# Patient Record
Sex: Male | Born: 1995 | Race: White | Hispanic: No | Marital: Single | Smoking: Never smoker
Health system: Southern US, Community
[De-identification: ages and names within clinical notes are randomized; demographics above are authoritative.]

## PROBLEM LIST (undated history)

## (undated) DIAGNOSIS — F909 Attention-deficit hyperactivity disorder, unspecified type: Secondary | ICD-10-CM

## (undated) DIAGNOSIS — S4990XA Unspecified injury of shoulder and upper arm, unspecified arm, initial encounter: Secondary | ICD-10-CM

## (undated) HISTORY — PX: TONSILLECTOMY: SUR1361

---
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
## 2014-08-18 ENCOUNTER — Ambulatory Visit (INDEPENDENT_AMBULATORY_CARE_PROVIDER_SITE_OTHER): Payer: BLUE CROSS/BLUE SHIELD | Admitting: Family Medicine

## 2014-08-18 VITALS — BP 108/62 | HR 83 | Temp 97.8°F | Resp 17 | Ht 66.5 in | Wt 178.6 lb

## 2014-08-18 DIAGNOSIS — R059 Cough, unspecified: Secondary | ICD-10-CM

## 2014-08-18 DIAGNOSIS — J069 Acute upper respiratory infection, unspecified: Secondary | ICD-10-CM

## 2014-08-18 DIAGNOSIS — R05 Cough: Secondary | ICD-10-CM

## 2014-08-18 DIAGNOSIS — R42 Dizziness and giddiness: Secondary | ICD-10-CM

## 2014-08-18 DIAGNOSIS — J00 Acute nasopharyngitis [common cold]: Secondary | ICD-10-CM

## 2014-08-18 MED ORDER — BENZONATATE 100 MG PO CAPS: 100.0000 mg | ORAL_CAPSULE | Freq: Three times a day (TID) | ORAL | Status: DC | PRN

## 2014-08-18 MED ORDER — IPRATROPIUM BROMIDE 0.03 % NA SOLN
2.0000 | Freq: Two times a day (BID) | NASAL | Status: DC
Start: 2014-08-18 — End: 2015-06-18

## 2014-08-18 NOTE — Progress Notes (Signed)
Subjective: 19 year old male who is here with history of being ill since Saturday. He does not work on the weekends. He has had some lightheadedness. He has a morning sore throat and mild ear pain. He's had a little cough. He does have some head stuffiness has been sniffling. She blows some stuff out of his nose. He does not smoke. He gets a lot of exercise with his work at [employer]. He lives at home with his parents. No one else at home is been ill. He does not use drugs and is not on any regular medications.  Objective: Normally healthy-appearing young man. His TMs are normal. Eyes PERRLA. EOMs intact. Nose is a little congested and inflamed looking. Throat was without erythema. Neck supple without significant nodes. Chest is clear to auscultation but he does have a intermittent hacking cough.  Assessment: Upper respiratory infection, viral illness.  Plan: Atrovent nasal to try and open up his head  Tessalon 1 or 2 pills 3 times daily as needed for cough Drink plenty of fluids and get plenty of rest Out of work through Wednesday. We can extend that another day or 2 if he is not feeling better.

## 2014-08-18 NOTE — Patient Instructions (Addendum)
Off work through Wednesday  Drink lots of fluids and get enough rest  Take Tylenol or ibuprofen if needed for pain  Take the Tessalon (benzonatate) 1 or 2 pills 3 times daily if needed for cough  Take the Atrovent (iprotropium)  nasal spray 2 sprays each nostril 3 times daily as needed for head congestion  If you should develop more shortness of breath or worsening cough, or high fevers, or generally are getting worse, return for recheck

## 2015-06-18 ENCOUNTER — Ambulatory Visit (INDEPENDENT_AMBULATORY_CARE_PROVIDER_SITE_OTHER): Payer: BLUE CROSS/BLUE SHIELD | Admitting: Emergency Medicine

## 2015-06-18 VITALS — BP 108/64 | HR 81 | Temp 98.1°F | Resp 18 | Ht 67.0 in | Wt 188.0 lb

## 2015-06-18 DIAGNOSIS — K529 Noninfective gastroenteritis and colitis, unspecified: Secondary | ICD-10-CM

## 2015-06-18 DIAGNOSIS — R1114 Bilious vomiting: Secondary | ICD-10-CM | POA: Diagnosis not present

## 2015-06-18 MED ORDER — RANITIDINE HCL 150 MG PO TABS
150.0000 mg | ORAL_TABLET | Freq: Once | ORAL | Status: AC
  Administered 2015-06-18: 150 mg via ORAL

## 2015-06-18 MED ORDER — LOPERAMIDE HCL 2 MG PO TABS: 2.0000 mg | ORAL_TABLET | Freq: Four times a day (QID) | ORAL | Status: DC | PRN

## 2015-06-18 MED ORDER — ONDANSETRON 4 MG PO TBDP
8.0000 mg | ORAL_TABLET | Freq: Once | ORAL | Status: AC
  Administered 2015-06-18: 8 mg via ORAL

## 2015-06-18 MED ORDER — ONDANSETRON HCL 4 MG PO TABS: 4.0000 mg | ORAL_TABLET | Freq: Three times a day (TID) | ORAL | Status: DC | PRN

## 2015-06-18 NOTE — Progress Notes (Signed)
06/18/2015 5:13 PM   DOB: [birth year] / MRN: [id number]  SUBJECTIVE:  Abdominal Pain This is a new problem. The current episode started today. The onset quality is sudden. The problem has been unchanged. The pain is located in the generalized abdominal region. The pain is at a severity of 7/10. The quality of the pain is aching. The abdominal pain does not radiate. Associated symptoms include anorexia, headaches, nausea and vomiting. Pertinent negatives include no belching, constipation, fever, flatus, frequency, hematuria, melena or weight loss. The pain is aggravated by vomiting. The pain is relieved by nothing. He has tried nothing for the symptoms. The treatment provided no relief. There is no history of abdominal surgery, irritable bowel syndrome or PUD.  He reports that he had cookout last night. Complains that he feels dizzy when standing up.     He is allergic to nickel.   He  has a past medical history of ADHD (attention deficit hyperactivity disorder) and Shoulder injury.    He  reports that he has never smoked. He does not have any smokeless tobacco history on file. He  has no sexual activity history on file. The patient  has past surgical history that includes tonsilectomy (01/15/2013).  His family history is not on file.  Review of Systems  Constitutional: Negative for fever and weight loss.  Gastrointestinal: Positive for nausea, vomiting, abdominal pain and anorexia. Negative for constipation, melena and flatus.  Genitourinary: Negative for frequency and hematuria.  Neurological: Positive for headaches.    Problem list and medications reviewed and updated by myself where necessary, and exist elsewhere in the encounter.   OBJECTIVE:  BP 108/64 mmHg  Pulse 81  Temp(Src) 98.1 F (36.7 C)  Resp 18  Ht 5\' 7"  (1.702 m)  Wt 188 lb (85.276 kg)  BMI 29.44 kg/m2  SpO2 98%  Physical Exam  Constitutional: He is oriented to person, place, and time. He appears well-developed  and well-nourished. He does not appear ill. No distress.  Eyes: Conjunctivae and EOM are normal. Pupils are equal, round, and reactive to light.  Cardiovascular: Normal rate and regular rhythm.   Pulmonary/Chest: Effort normal and breath sounds normal.  Abdominal: Soft. Bowel sounds are normal. He exhibits no distension and no mass. There is tenderness (generalized). There is no rebound and no guarding.  Musculoskeletal: Normal range of motion.  Neurological: He is alert and oriented to person, place, and time. No cranial nerve deficit. Coordination normal.  Skin: Skin is warm and dry. He is not diaphoretic. There is pallor.  Psychiatric: He has a normal mood and affect.  Nursing note and vitals reviewed.   No results found for this or any previous visit (from the past 48 hour(s)).  ASSESSMENT AND PLAN  Eliberto Ivoryustin was seen today for emesis and abdominal pain.  Diagnoses and all orders for this visit:  Acute gastroenteritis: This is most likely Viral or toxin mediated. Symptoms resolved with 1 liter of fluid and 8 of zofran. Will send him home with imodium and zofran. RTC as needed.    -     Insert peripheral IV -     ranitidine (ZANTAC) tablet 150 mg; Take 1 tablet (150 mg total) by mouth once. -     ondansetron (ZOFRAN-ODT) disintegrating tablet 8 mg; Take 2 tablets (8 mg total) by mouth once.  Bilious vomiting with nausea -     ondansetron (ZOFRAN) 4 MG tablet; Take 1 tablet (4 mg total) by mouth every 8 (eight) hours as needed  for nausea or vomiting.    The patient was advised to call or return to clinic if he does not see an improvement in symptoms or to seek the care of the closest emergency department if he worsens with the above plan.   Deliah Boston, MHS, PA-C [facility] 06/18/2015 5:13 PM

## 2015-06-21 NOTE — Progress Notes (Signed)
  Medical screening examination/treatment/procedure(s) were performed by non-physician practitioner and as supervising physician I was immediately available for consultation/collaboration.     

## 2016-06-28 DIAGNOSIS — H6693 Otitis media, unspecified, bilateral: Secondary | ICD-10-CM | POA: Diagnosis not present

## 2016-06-28 DIAGNOSIS — J019 Acute sinusitis, unspecified: Secondary | ICD-10-CM | POA: Diagnosis not present

## 2016-06-28 DIAGNOSIS — H109 Unspecified conjunctivitis: Secondary | ICD-10-CM | POA: Diagnosis not present

## 2017-02-09 ENCOUNTER — Encounter: Payer: Self-pay | Admitting: Physician Assistant

## 2017-02-09 ENCOUNTER — Ambulatory Visit (INDEPENDENT_AMBULATORY_CARE_PROVIDER_SITE_OTHER): Payer: BLUE CROSS/BLUE SHIELD | Admitting: Physician Assistant

## 2017-02-09 ENCOUNTER — Ambulatory Visit (INDEPENDENT_AMBULATORY_CARE_PROVIDER_SITE_OTHER): Payer: BLUE CROSS/BLUE SHIELD

## 2017-02-09 VITALS — BP 122/76 | HR 51 | Temp 98.8°F | Resp 16 | Ht 66.5 in | Wt 177.6 lb

## 2017-02-09 DIAGNOSIS — S93601A Unspecified sprain of right foot, initial encounter: Secondary | ICD-10-CM | POA: Diagnosis not present

## 2017-02-09 DIAGNOSIS — S9031XA Contusion of right foot, initial encounter: Secondary | ICD-10-CM | POA: Diagnosis not present

## 2017-02-09 DIAGNOSIS — M79671 Pain in right foot: Secondary | ICD-10-CM | POA: Diagnosis not present

## 2017-02-09 MED ORDER — MELOXICAM 15 MG PO TABS: 15.0000 mg | ORAL_TABLET | Freq: Every day | ORAL | 0 refills | Status: AC

## 2017-02-09 NOTE — Progress Notes (Signed)
   AD GLAVE  MRN: [id number] DOB: 07-02-1995  PCP: Patient, No Pcp Per  Subjective:  Pt is a 21 year old male who presents to clinic for right foot injury. He was playing basketball 8 days ago when he rolled his right foot toward the outside and "felt like my foot was on the hardwood floor". Pain is located on the outside of his 5th toe. Endorses bruising a few days ago. Pain is worse when he rolls his foot to the outside. No pain with walking or standing. Pain is worse when he rests after being on it for a long period of time.   He has elevated and iced his foot. He has taken a few doses of Advil.  Denies reduced ROM, muscle weakness, n/t.   Review of Systems  Musculoskeletal: Positive for arthralgias (right foot) and gait problem.  Skin: Positive for color change (bruising).    There are no active problems to display for this patient.   No current outpatient prescriptions on file prior to visit.   No current facility-administered medications on file prior to visit.     Allergies  Allergen Reactions  . Nickel      Objective:  BP 122/76   Pulse (!) 51   Temp 98.8 F (37.1 C) (Oral)   Resp 16   Ht 5' 6.5" (1.689 m)   Wt 177 lb 9.6 oz (80.6 kg)   SpO2 98%   BMI 28.24 kg/m   Physical Exam  Constitutional: He is well-developed, well-nourished, and in no distress.  Musculoskeletal:       Right foot: There is bony tenderness (distal 5th metatarsal). There is normal range of motion, no swelling, no crepitus and no deformity.  Skin: Skin is warm and dry.  Psychiatric: Mood, memory, affect and judgment normal.    Dg Foot Complete Right  Result Date: 02/09/2017 CLINICAL DATA:  Eversion injury, bruising. Tender to palpation distal fourth and fifth metatarsals. EXAM: RIGHT FOOT COMPLETE - 3+ VIEW COMPARISON:  None. FINDINGS: There is no evidence of fracture or dislocation. There is no evidence of arthropathy or other focal bone abnormality. Soft tissues are  unremarkable. IMPRESSION: Negative. Electronically Signed   By: Charlett Nose M.D.   On: 02/09/2017 08:44    Assessment and Plan :  1. Sprain of right foot, initial encounter 2. Right foot pain - meloxicam (MOBIC) 15 MG tablet; Take 1 tablet (15 mg total) by mouth daily.  Dispense: 45 tablet; Refill: 0 - DG Foot Complete Right; Future - X-ray is negative. Suspect sprain. Ace wrap applied to right foot - he endorses relief with the pressure. Advised wearing shoes with good support. Con't wearing ace wrap x 3-4 weeks. Rehab exercises printed out and discussed with pt. He agrees with plan.   Marco Collie, PA-C  [facility] at [facility] 02/09/2017 8:26 AM

## 2017-02-09 NOTE — Patient Instructions (Addendum)
You have a sprained foot. This injury may take 5-7 weeks to heal. Please follow instruction below:  Put ice in a plastic bag. Place a towel between your skin and the bag or between your plaster splint and the bag. Leave the ice on for 20 minutes, 2-3 times a day.  Meloxicam for pain and swelling.  Keep the area elevated above the level of your heart. Do this 2-3 times a day until swelling improves.  Wrap your foot in an elastic bandage while you are active.  Wear shoes with good support while you are at work.  See the print out of rehab exercises. This will help your foot get back to 100% quicker.   Come back in 5-6 weeks if you are not seeing improvement.   Thank you for coming in today. I hope you feel we met your needs.  Feel free to call PCP if you have any questions or further requests.  Please consider signing up for MyChart if you do not already have it, as this is a great way to communicate with me.  Best,  Whitney McVey, PA-C  IF you received an x-ray today, you will receive an invoice from [facility]. Please contact [facility] at [phone] with questions or concerns regarding your invoice.   IF you received labwork today, you will receive an invoice from [facility]. Please contact [facility] at [phone] with questions or concerns regarding your invoice.   Our billing staff will not be able to assist you with questions regarding bills from these companies.  You will be contacted with the lab results as soon as they are available. The fastest way to get your results is to activate your My Chart account. Instructions are located on the last page of this paperwork. If you have not heard from [place] regarding the results in 2 weeks, please contact this office.

## 2018-04-16 DIAGNOSIS — J329 Chronic sinusitis, unspecified: Secondary | ICD-10-CM | POA: Diagnosis not present

## 2018-07-20 IMAGING — DX DG FOOT COMPLETE 3+V*R*
3 series · 3 of 3 positions shown · non-contrast
Comparison: None.

CLINICAL DATA: Eversion injury, bruising. Tender to palpation
distal fourth and fifth metatarsals.

EXAM:
RIGHT FOOT COMPLETE - 3+ VIEW

[foot ap]
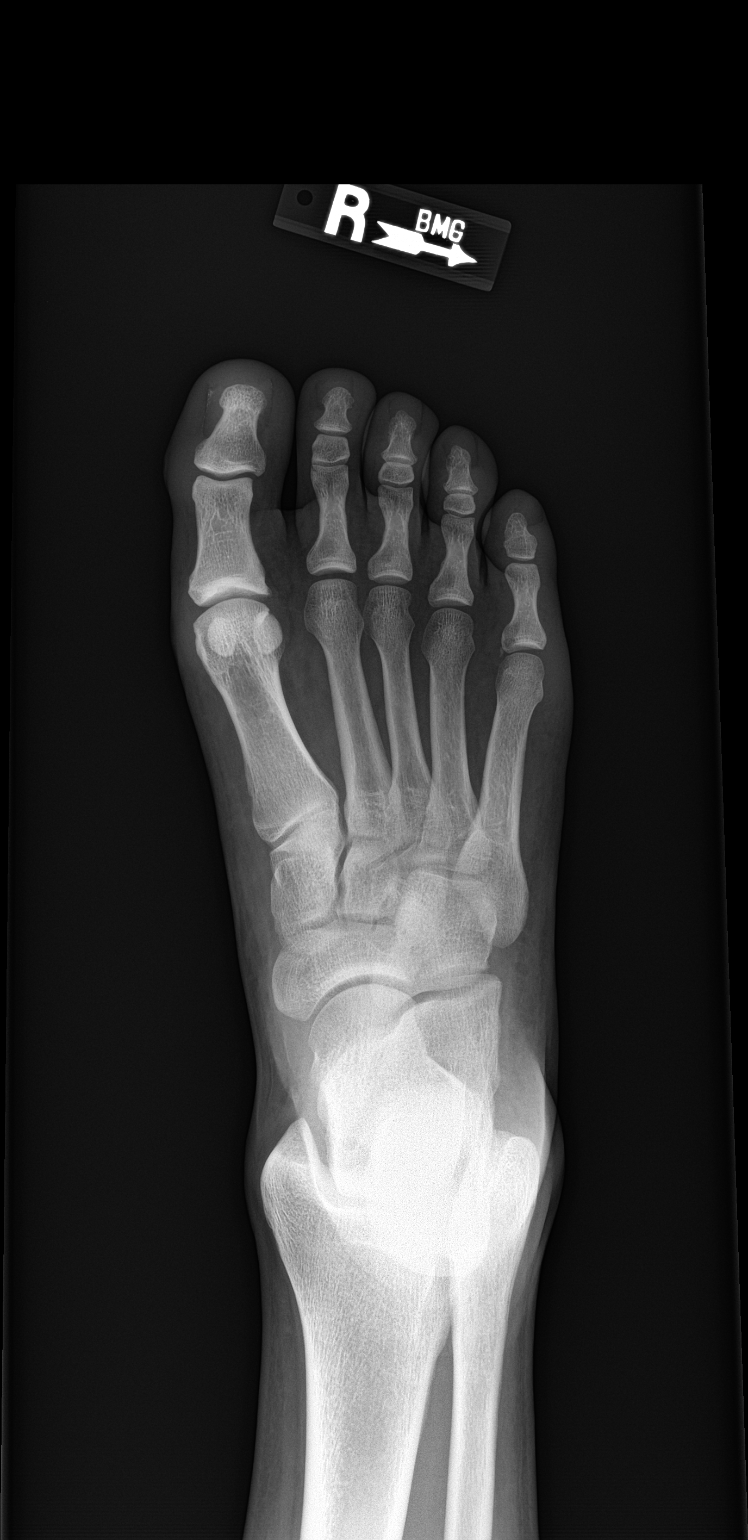

[foot obl]
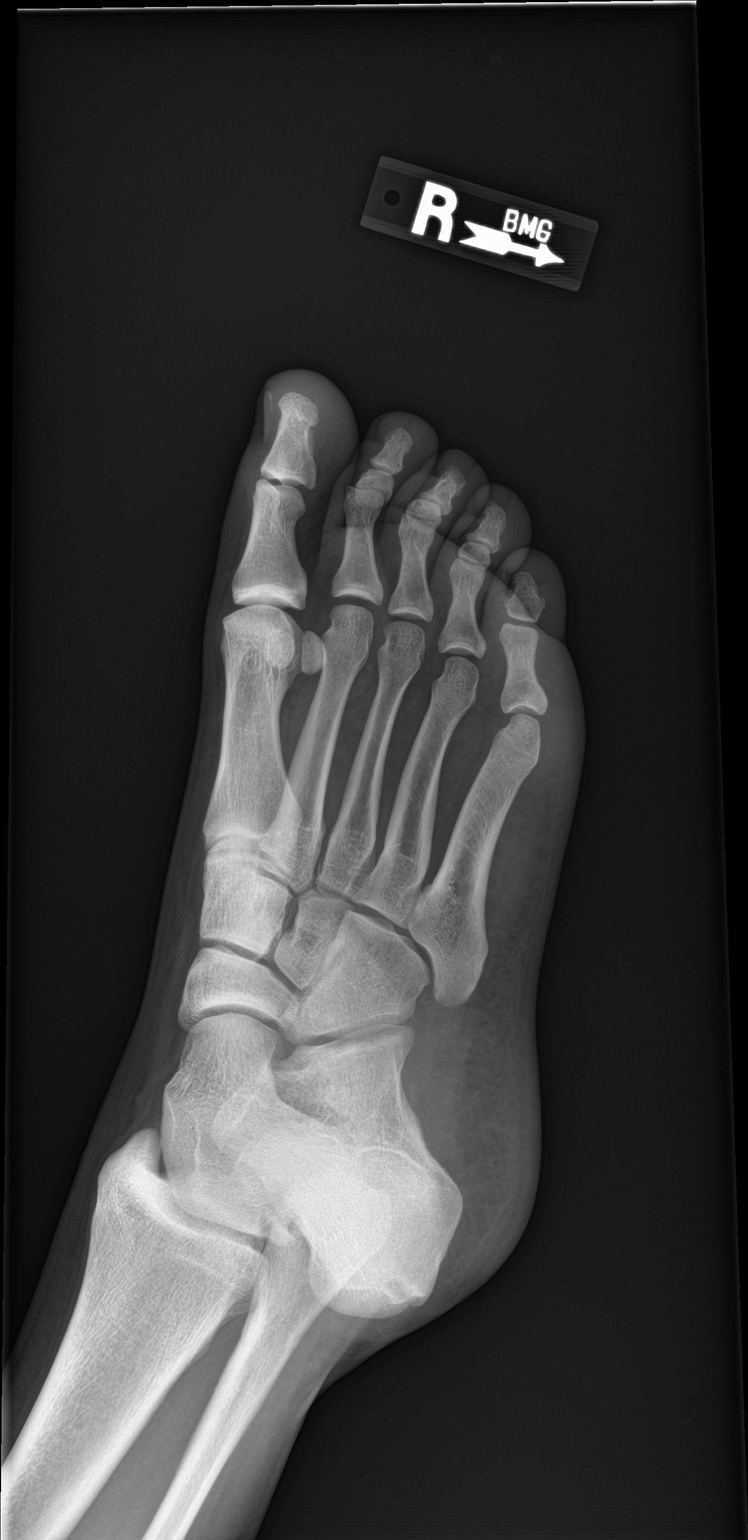

[foot lat]
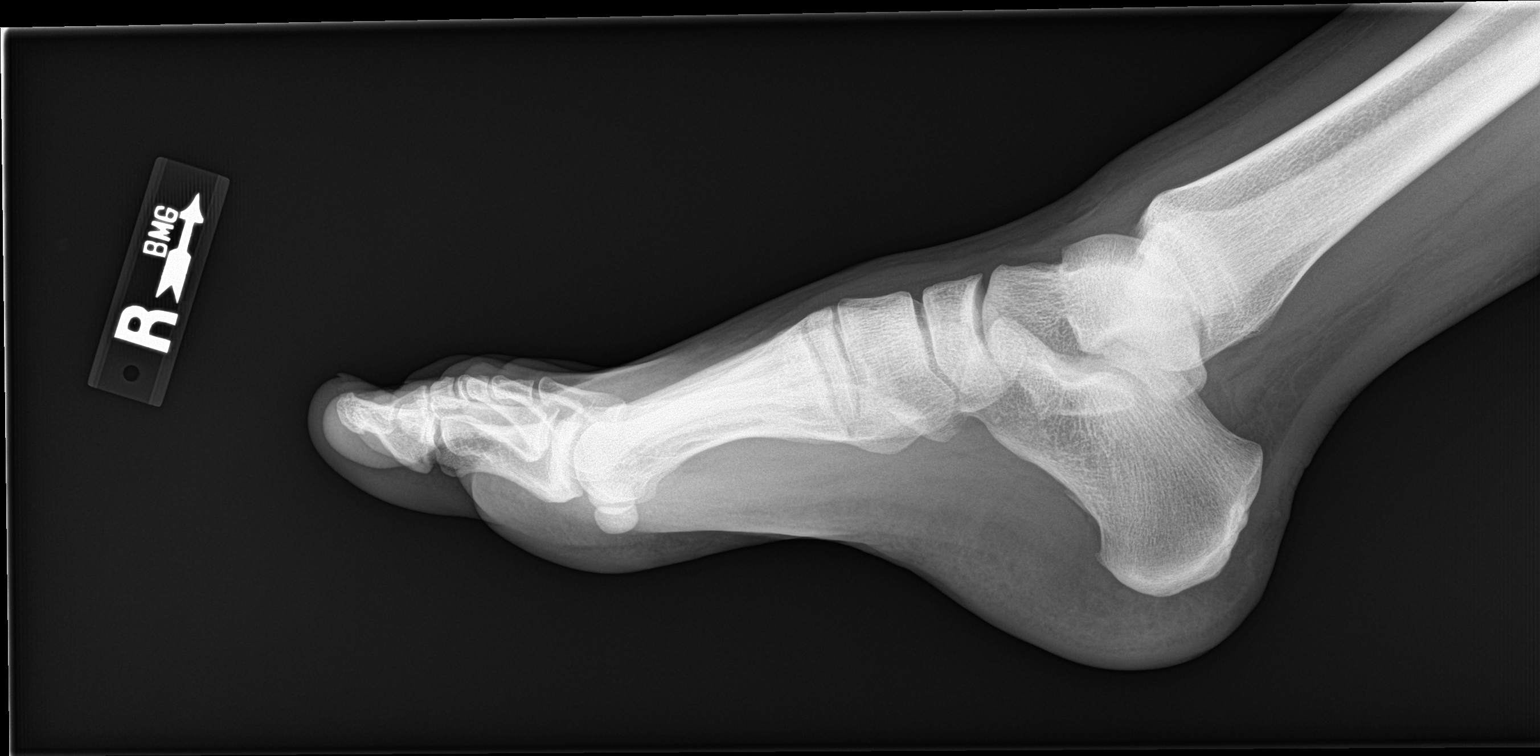

[3 of 3 positions shown; findings below may reference images not displayed]

FINDINGS: There is no evidence of fracture or dislocation. There is no
evidence of arthropathy or other focal bone abnormality. Soft
tissues are unremarkable.
IMPRESSION: Negative.

## 2022-11-14 ENCOUNTER — Ambulatory Visit
Admission: EM | Admit: 2022-11-14 | Discharge: 2022-11-14 | Disposition: A | Discharge status: Home / Self Care | Payer: BC Managed Care – PPO | Attending: Urgent Care | Admitting: Urgent Care

## 2022-11-14 DIAGNOSIS — J019 Acute sinusitis, unspecified: Secondary | ICD-10-CM

## 2022-11-14 LAB — POCT RAPID STREP A (OFFICE): Rapid Strep A Screen: NEGATIVE

## 2022-11-14 MED ORDER — AMOXICILLIN 875 MG PO TABS: 875.0000 mg | ORAL_TABLET | Freq: Two times a day (BID) | ORAL | 0 refills | Status: AC

## 2022-11-14 MED ORDER — CETIRIZINE HCL 10 MG PO TABS: 10.0000 mg | ORAL_TABLET | Freq: Every day | ORAL | 0 refills | Status: AC

## 2022-11-14 MED ORDER — PSEUDOEPHEDRINE HCL 60 MG PO TABS: 60.0000 mg | ORAL_TABLET | Freq: Three times a day (TID) | ORAL | 0 refills | Status: AC | PRN

## 2022-11-14 NOTE — ED Provider Notes (Signed)
[facility]  Note:  This document was prepared using Conservation officer, historic buildings and may include unintentional dictation errors.  MRN: [id number] DOB: 20-May-1996  Subjective:   Tyrone Hayden is a 27 y.o. male presenting for 9 day history of sinus congestion, intermittent right ear pain, throat pain, painful swallowing, sinus headaches, frontal sinus pain, mild occasional cough. Mucus is thick, green. No chest pain, shob, body aches. No history of asthma. No smoking of any kind including cigarettes, cigars, vaping, marijuana use.    No current facility-administered medications for this encounter.  Current Outpatient Medications:    meloxicam (MOBIC) 15 MG tablet, Take 1 tablet (15 mg total) by mouth daily., Disp: 45 tablet, Rfl: 0   Allergies  Allergen Reactions   Nickel     Past Medical History:  Diagnosis Date   ADHD (attention deficit hyperactivity disorder)    Shoulder injury    right     Past Surgical History:  Procedure Laterality Date   tonsilectomy  01/15/2013    No family history on file.  Social History   Tobacco Use   Smoking status: Never   Smokeless tobacco: Never  Substance Use Topics   Alcohol use: Yes    Alcohol/week: 2.0 standard drinks of alcohol    Types: 2 Standard drinks or equivalent per week   Drug use: No    ROS   Objective:   Vitals: BP 120/76 (BP Location: Right Arm)   Pulse 62   Temp 97.7 F (36.5 C) (Oral)   Resp 18   SpO2 96%   Physical Exam Constitutional:      General: He is not in acute distress.    Appearance: Normal appearance. He is well-developed and normal weight. He is not ill-appearing, toxic-appearing or diaphoretic.  HENT:     Head: Normocephalic and atraumatic.     Right Ear: Tympanic membrane, ear canal and external ear normal. No drainage, swelling or tenderness. No middle ear effusion. There is no impacted cerumen. Tympanic membrane is not erythematous or bulging.     Left  Ear: Tympanic membrane, ear canal and external ear normal. No drainage, swelling or tenderness.  No middle ear effusion. There is no impacted cerumen. Tympanic membrane is not erythematous or bulging.     Nose: Nasal tenderness, congestion and rhinorrhea present.     Right Turbinates: Swollen.     Left Turbinates: Swollen.     Right Sinus: Frontal sinus tenderness present. No maxillary sinus tenderness.     Left Sinus: Frontal sinus tenderness present. No maxillary sinus tenderness.     Mouth/Throat:     Mouth: Mucous membranes are moist.     Pharynx: No pharyngeal swelling, oropharyngeal exudate, posterior oropharyngeal erythema or uvula swelling.     Tonsils: No tonsillar exudate or tonsillar abscesses. 0 on the right. 0 on the left.  Eyes:     General: No scleral icterus.       Right eye: No discharge.        Left eye: No discharge.     Extraocular Movements: Extraocular movements intact.     Conjunctiva/sclera: Conjunctivae normal.  Cardiovascular:     Rate and Rhythm: Normal rate and regular rhythm.     Heart sounds: Normal heart sounds. No murmur heard.    No friction rub. No gallop.  Pulmonary:     Effort: Pulmonary effort is normal. No respiratory distress.     Breath sounds: Normal breath sounds. No stridor. No wheezing,  rhonchi or rales.  Musculoskeletal:     Cervical back: Normal range of motion and neck supple. No rigidity. No muscular tenderness.  Neurological:     General: No focal deficit present.     Mental Status: He is alert and oriented to person, place, and time.     Cranial Nerves: No cranial nerve deficit.     Motor: No weakness.     Coordination: Coordination normal.     Gait: Gait normal.  Psychiatric:        Mood and Affect: Mood normal.        Behavior: Behavior normal.        Thought Content: Thought content normal.        Judgment: Judgment normal.     Results for orders placed or performed during the hospital encounter of 11/14/22 (from the past 24  hour(s))  POCT rapid strep A     Status: None   Collection Time: 11/14/22  8:27 AM  Result Value Ref Range   Rapid Strep A Screen Negative Negative     Assessment and Plan :   PDMP not reviewed this encounter.  1. Acute sinusitis, recurrence not specified, unspecified location    Deferred imaging given clear cardiopulmonary exam, hemodynamically stable vital signs. Will start empiric treatment for sinusitis with amoxicillin.  Recommended supportive care otherwise. Counseled patient on potential for adverse effects with medications prescribed/recommended today, ER and return-to-clinic precautions discussed, patient verbalized understanding.    Wallis Bamberg, [place] 11/14/22 [phone]

## 2022-11-14 NOTE — ED Triage Notes (Signed)
Pt c/o sore throat, HA x 9 days-no meds PTA-NAD-steady gait

## 2022-11-14 NOTE — Discharge Instructions (Signed)
We will manage this as a sinus infection with amoxicillin. For sore throat or cough try using a honey-based tea. Use 3 teaspoons of honey with juice squeezed from half lemon. Place shaved pieces of ginger into 1/2-1 cup of water and warm over stove top. Then mix the ingredients and repeat every 4 hours as needed. Please take ibuprofen 600mg every 6 hours with food alternating with OR taken together with Tylenol 500mg-650mg every 6 hours for throat pain, fevers, aches and pains. Hydrate very well with at least 2 liters of water. Eat light meals such as soups (chicken and noodles, vegetable, chicken and wild rice).  Do not eat foods that you are allergic to.  Taking an antihistamine like Zyrtec can help against postnasal drainage, sinus congestion which can cause sinus pain, sinus headaches, throat pain, painful swallowing, coughing.  You can take this together with pseudoephedrine (Sudafed) at a dose of 60 mg 3 times a day or twice daily as needed for the same kind of nasal drip, congestion.
